# Patient Record
Sex: Male | Born: 1970 | Race: Black or African American | Hispanic: No | Marital: Single | Smoking: Never smoker
Health system: Southern US, Community
[De-identification: ages and names within clinical notes are randomized; demographics above are authoritative.]

## PROBLEM LIST (undated history)

## (undated) HISTORY — PX: WISDOM TOOTH EXTRACTION: SHX21

---

## 2019-06-08 ENCOUNTER — Encounter (HOSPITAL_BASED_OUTPATIENT_CLINIC_OR_DEPARTMENT_OTHER): Payer: Self-pay | Admitting: Oncology

## 2019-06-08 ENCOUNTER — Emergency Department (HOSPITAL_BASED_OUTPATIENT_CLINIC_OR_DEPARTMENT_OTHER)
Admission: EM | Admit: 2019-06-08 | Discharge: 2019-06-09 | Disposition: A | Discharge status: Home / Self Care | Payer: No Typology Code available for payment source | Attending: Emergency Medicine | Admitting: Emergency Medicine

## 2019-06-08 ENCOUNTER — Other Ambulatory Visit: Payer: Self-pay

## 2019-06-08 DIAGNOSIS — M62838 Other muscle spasm: Secondary | ICD-10-CM | POA: Insufficient documentation

## 2019-06-08 DIAGNOSIS — M546 Pain in thoracic spine: Secondary | ICD-10-CM

## 2019-06-08 DIAGNOSIS — R519 Headache, unspecified: Secondary | ICD-10-CM | POA: Diagnosis not present

## 2019-06-08 DIAGNOSIS — M542 Cervicalgia: Secondary | ICD-10-CM | POA: Insufficient documentation
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
## 2019-06-09 ENCOUNTER — Emergency Department (HOSPITAL_BASED_OUTPATIENT_CLINIC_OR_DEPARTMENT_OTHER): Payer: No Typology Code available for payment source

## 2019-06-09 MED ORDER — CYCLOBENZAPRINE HCL 10 MG PO TABS: 10.0000 mg | ORAL_TABLET | Freq: Two times a day (BID) | ORAL | 0 refills | Status: AC | PRN
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
